# Patient Record
Sex: Male | Born: 1975 | Race: Black or African American | Hispanic: No | Marital: Married | Smoking: Never smoker
Health system: Southern US, Community
[De-identification: ages and names within clinical notes are randomized; demographics above are authoritative.]

## PROBLEM LIST (undated history)

## (undated) DIAGNOSIS — I10 Essential (primary) hypertension: Secondary | ICD-10-CM

---

## 2007-09-29 ENCOUNTER — Ambulatory Visit: Payer: Self-pay | Admitting: Family Medicine

## 2013-10-18 DIAGNOSIS — Z3169 Encounter for other general counseling and advice on procreation: Secondary | ICD-10-CM | POA: Insufficient documentation

## 2015-10-10 ENCOUNTER — Other Ambulatory Visit: Payer: Self-pay | Admitting: Nurse Practitioner

## 2015-10-10 ENCOUNTER — Ambulatory Visit
Admission: RE | Admit: 2015-10-10 | Discharge: 2015-10-10 | Disposition: A | Discharge status: Home / Self Care | Payer: Managed Care, Other (non HMO) | Source: Ambulatory Visit | Attending: Nurse Practitioner | Admitting: Nurse Practitioner

## 2015-10-10 DIAGNOSIS — R1011 Right upper quadrant pain: Secondary | ICD-10-CM | POA: Diagnosis present

## 2015-10-10 DIAGNOSIS — R109 Unspecified abdominal pain: Secondary | ICD-10-CM

## 2015-11-15 DIAGNOSIS — R079 Chest pain, unspecified: Secondary | ICD-10-CM | POA: Insufficient documentation

## 2016-04-11 DIAGNOSIS — Z79899 Other long term (current) drug therapy: Secondary | ICD-10-CM | POA: Insufficient documentation

## 2016-04-11 DIAGNOSIS — L209 Atopic dermatitis, unspecified: Secondary | ICD-10-CM | POA: Insufficient documentation

## 2017-01-28 DIAGNOSIS — E7800 Pure hypercholesterolemia, unspecified: Secondary | ICD-10-CM | POA: Insufficient documentation

## 2017-01-31 ENCOUNTER — Other Ambulatory Visit: Payer: Self-pay | Admitting: Internal Medicine

## 2017-01-31 DIAGNOSIS — R109 Unspecified abdominal pain: Secondary | ICD-10-CM

## 2017-02-05 ENCOUNTER — Ambulatory Visit
Admission: RE | Admit: 2017-02-05 | Discharge: 2017-02-05 | Disposition: A | Discharge status: Home / Self Care | Payer: Managed Care, Other (non HMO) | Source: Ambulatory Visit | Attending: Internal Medicine | Admitting: Internal Medicine

## 2017-02-05 DIAGNOSIS — R93422 Abnormal radiologic findings on diagnostic imaging of left kidney: Secondary | ICD-10-CM | POA: Insufficient documentation

## 2017-02-05 DIAGNOSIS — R109 Unspecified abdominal pain: Secondary | ICD-10-CM | POA: Diagnosis present

## 2017-02-11 DIAGNOSIS — Q632 Ectopic kidney: Secondary | ICD-10-CM | POA: Insufficient documentation

## 2018-01-06 IMAGING — US US ABDOMEN LIMITED
1 series · 14 of 25 positions shown · non-contrast
Comparison: None.

CLINICAL DATA: Right upper quadrant pain

EXAM:
US ABDOMEN LIMITED - RIGHT UPPER QUADRANT

[Series 1: us abdomen limited · 0.20mm/px · 14 of 53 slices shown]
[im 1/53]
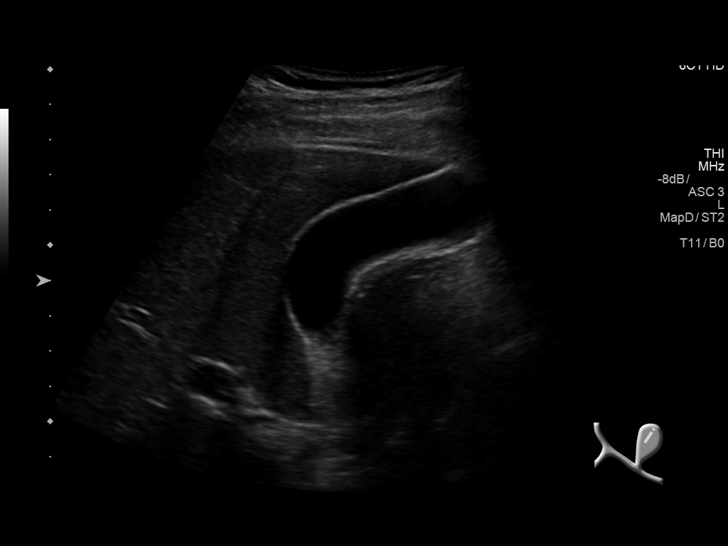
[im 5/53]
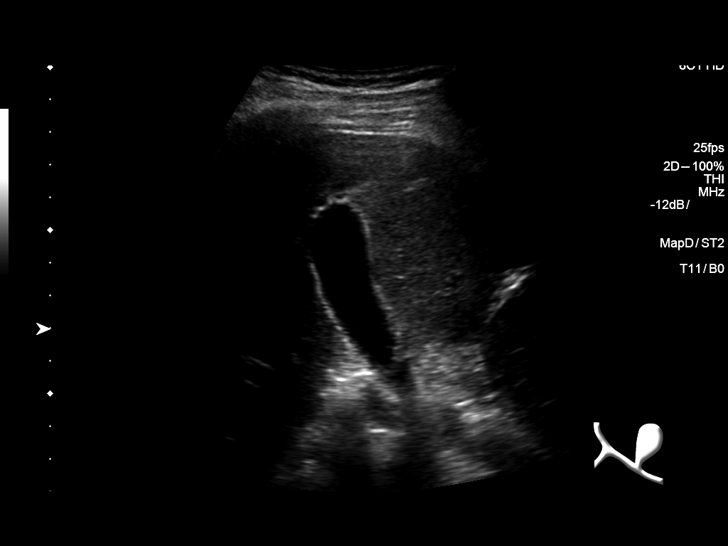
[im 9/53]
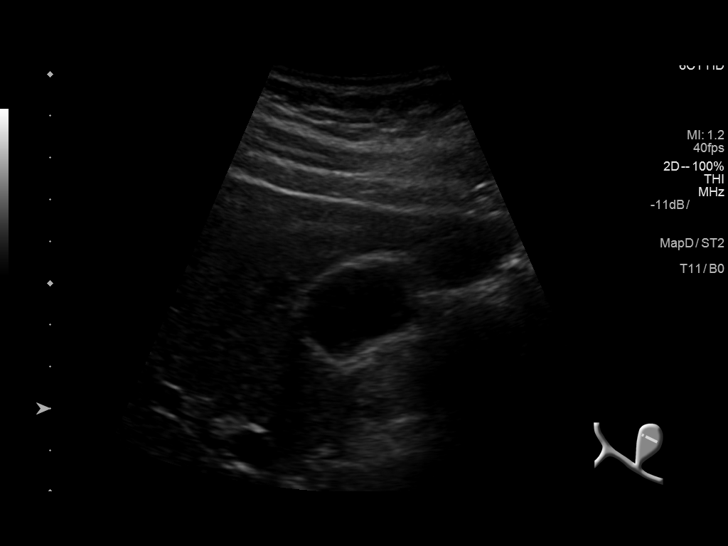
[im 14/53]
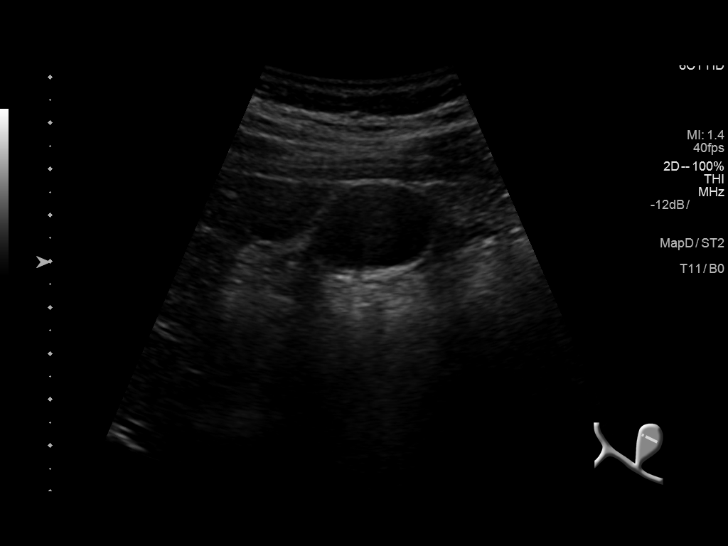
[im 18/53]
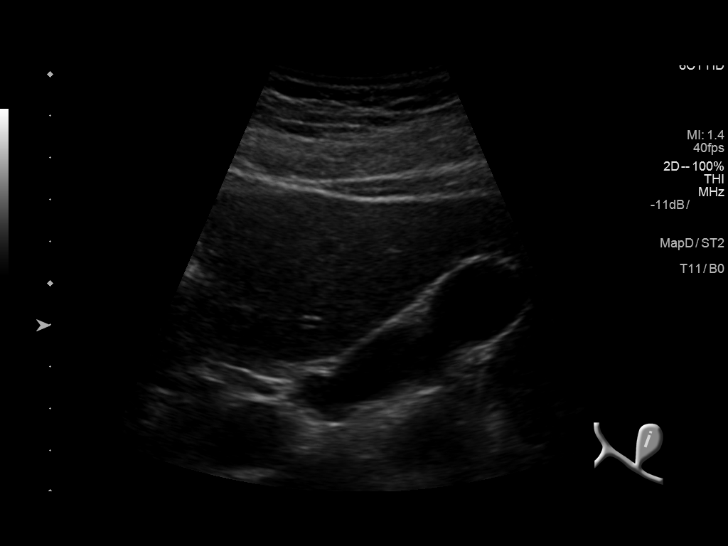
[im 20/53]
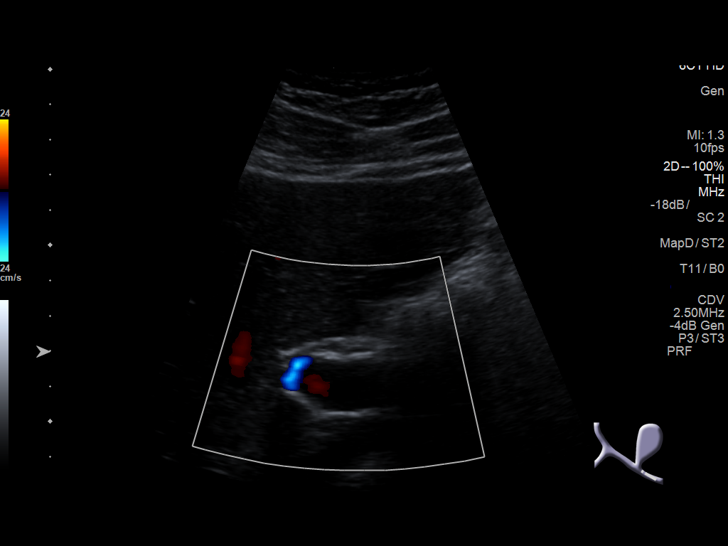
[im 24/53]
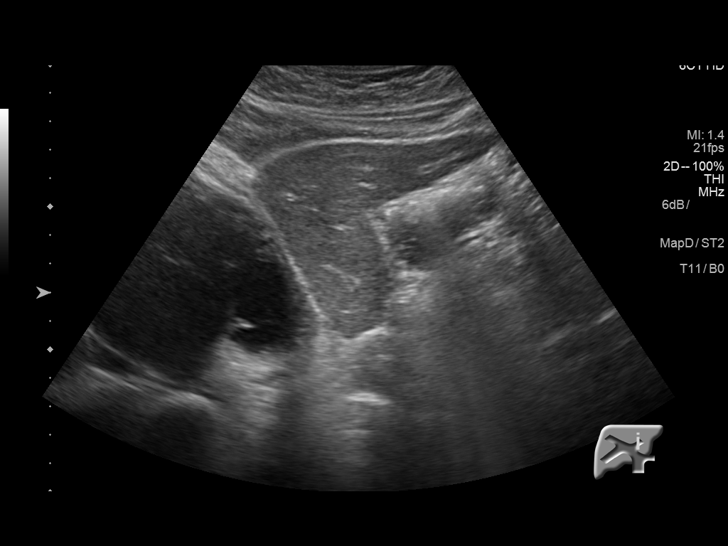
[im 29/53]
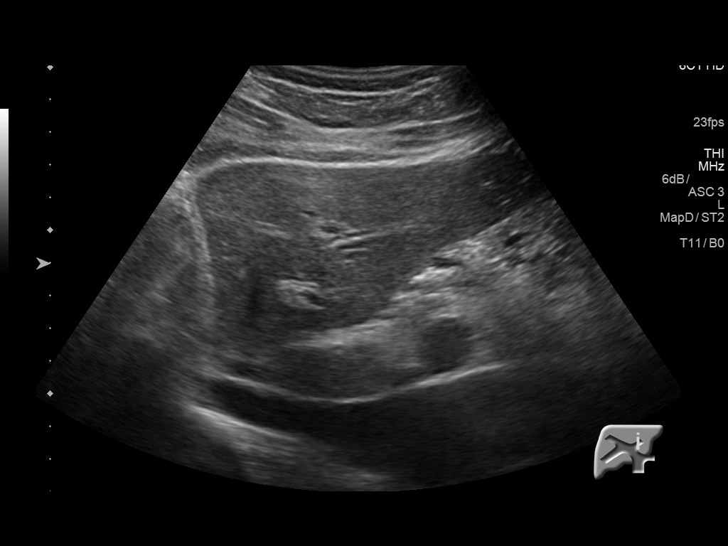
[im 33/53]
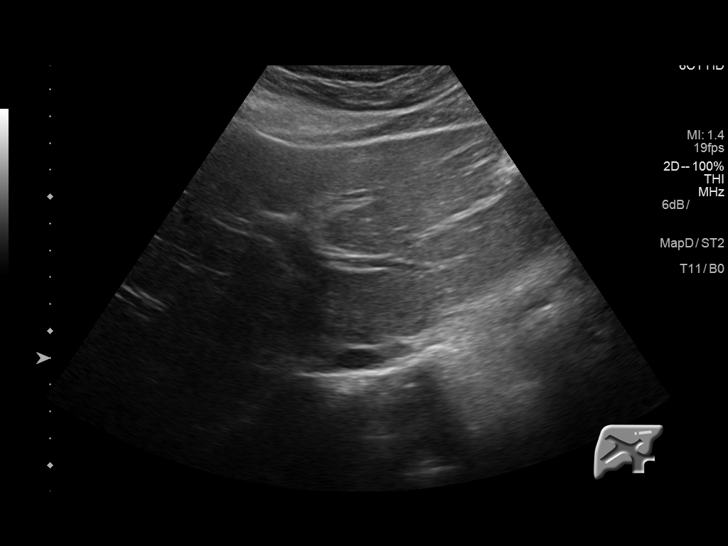
[im 35/53]
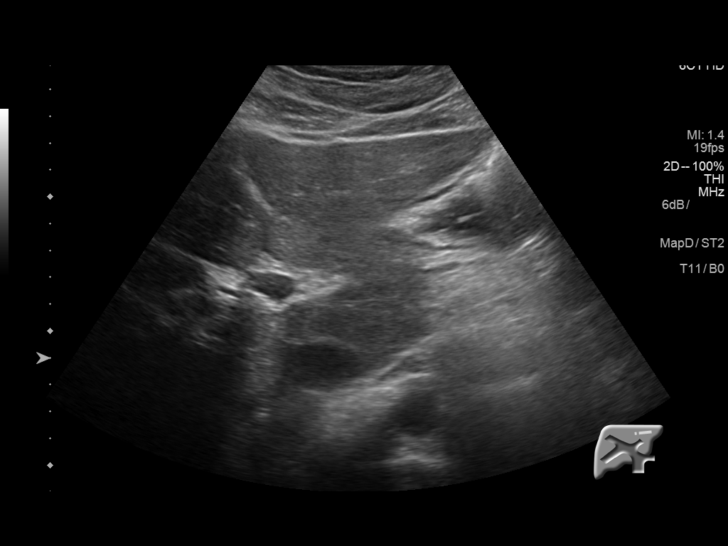
[im 40/53]
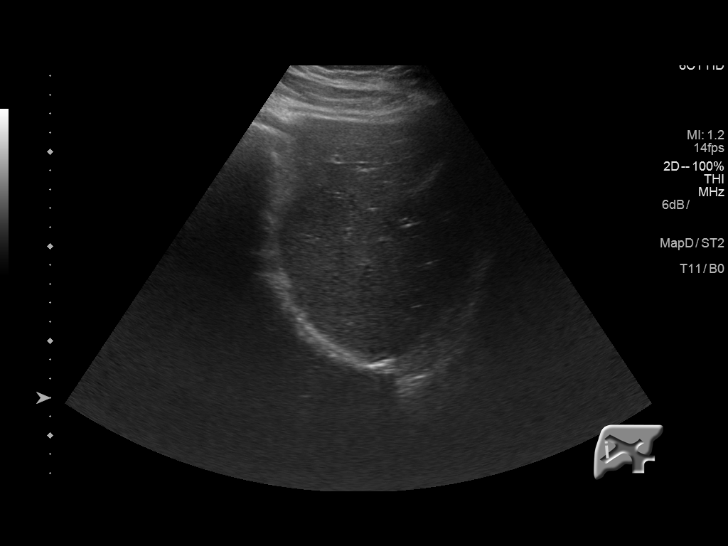
[im 44/53]
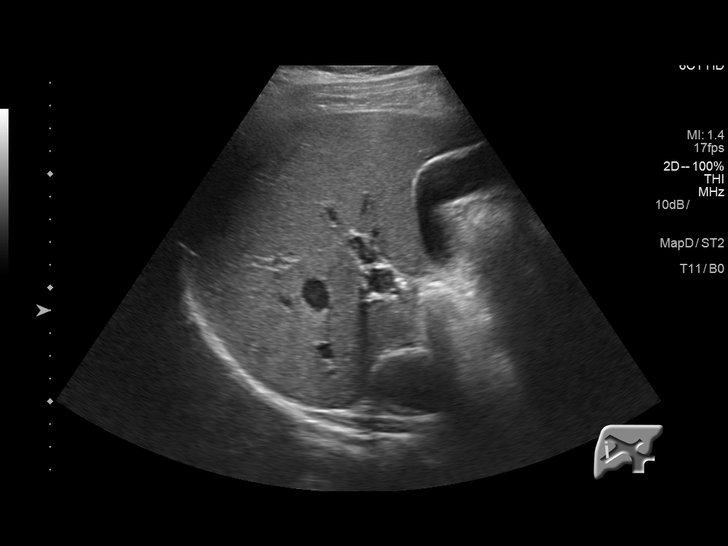
[im 48/53]
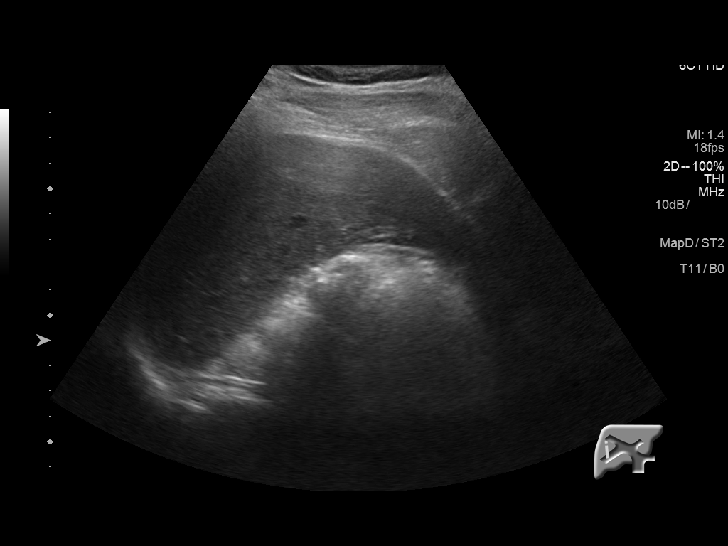
[im 53/53]
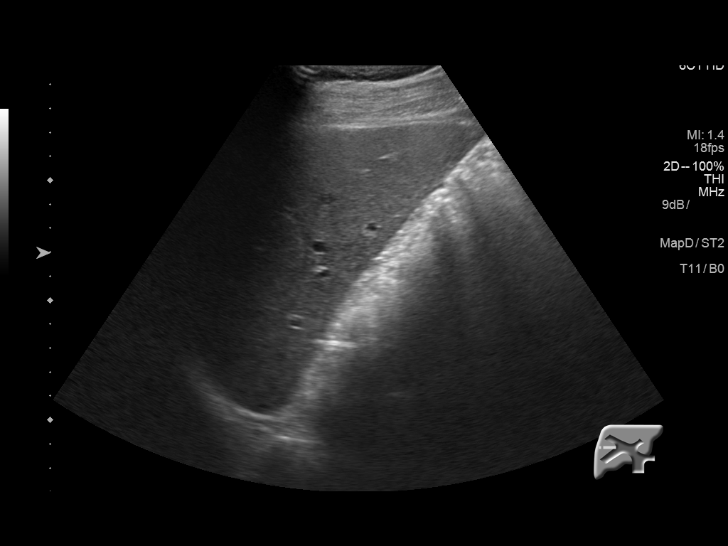

[14 of 25 positions shown; findings below may reference images not displayed]

FINDINGS: Gallbladder:

No gallstones or wall thickening visualized. No pericholecystic
fluid. No sonographic Murphy sign noted by sonographer.

Common bile duct:

Diameter: 2 mm. No intrahepatic or extrahepatic biliary duct
dilatation.

Liver:

No focal lesion identified. Within normal limits in parenchymal
echogenicity.

Right kidney is not appreciable in the expected right hepatorenal
fossa location.
IMPRESSION: Right kidney is not appreciable in the expected right hepatorenal
fossa region. Question absent or possibly heterotopic location for
right kidney. Study otherwise unremarkable.

## 2018-12-04 ENCOUNTER — Other Ambulatory Visit: Payer: Self-pay | Admitting: Internal Medicine

## 2018-12-04 DIAGNOSIS — N5089 Other specified disorders of the male genital organs: Secondary | ICD-10-CM

## 2018-12-08 ENCOUNTER — Encounter (INDEPENDENT_AMBULATORY_CARE_PROVIDER_SITE_OTHER): Payer: Self-pay

## 2018-12-08 ENCOUNTER — Ambulatory Visit
Admission: RE | Admit: 2018-12-08 | Discharge: 2018-12-08 | Disposition: A | Discharge status: Home / Self Care | Payer: Managed Care, Other (non HMO) | Source: Ambulatory Visit | Attending: Internal Medicine | Admitting: Internal Medicine

## 2018-12-08 ENCOUNTER — Other Ambulatory Visit: Payer: Self-pay

## 2018-12-08 DIAGNOSIS — I861 Scrotal varices: Secondary | ICD-10-CM | POA: Insufficient documentation

## 2018-12-08 DIAGNOSIS — N5089 Other specified disorders of the male genital organs: Secondary | ICD-10-CM | POA: Insufficient documentation

## 2019-05-05 IMAGING — US US ABDOMEN COMPLETE
1 series · 14 of 25 positions shown · non-contrast
Comparison: 10/10/2015

CLINICAL DATA: Bilateral flank pain intermittent [REDACTED].

EXAM:
ABDOMEN ULTRASOUND COMPLETE

[Series 1: us abdomen complete · 0.23mm/px · 14 of 98 slices shown]
[im 1/98]
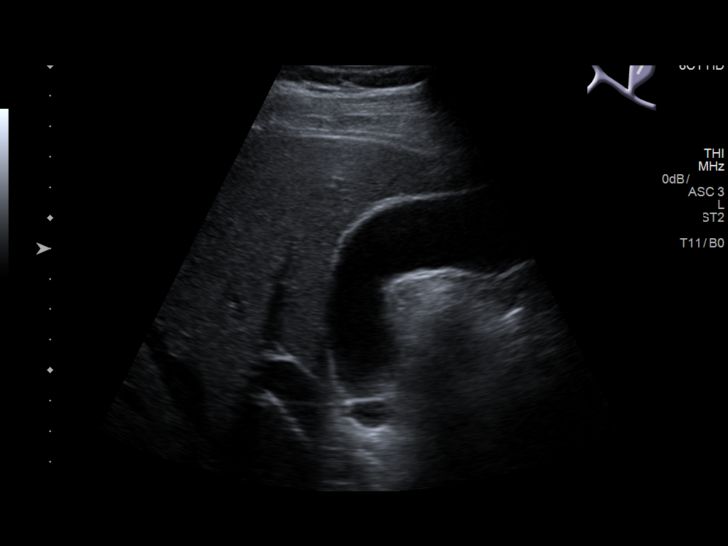
[im 9/98]
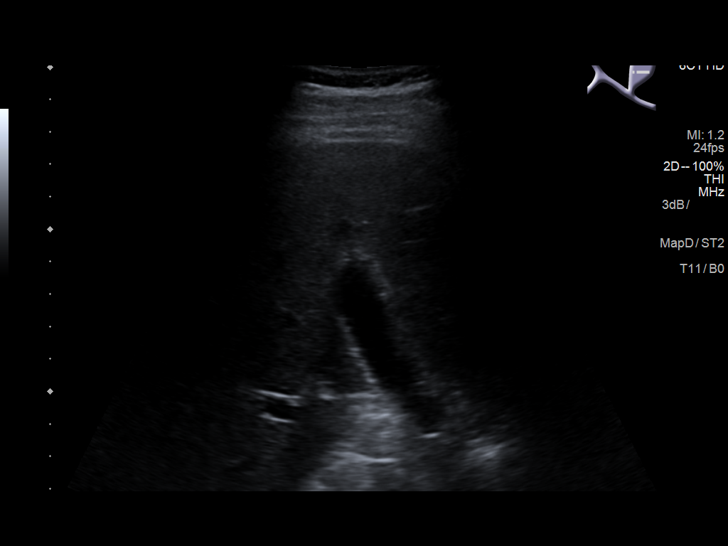
[im 17/98]
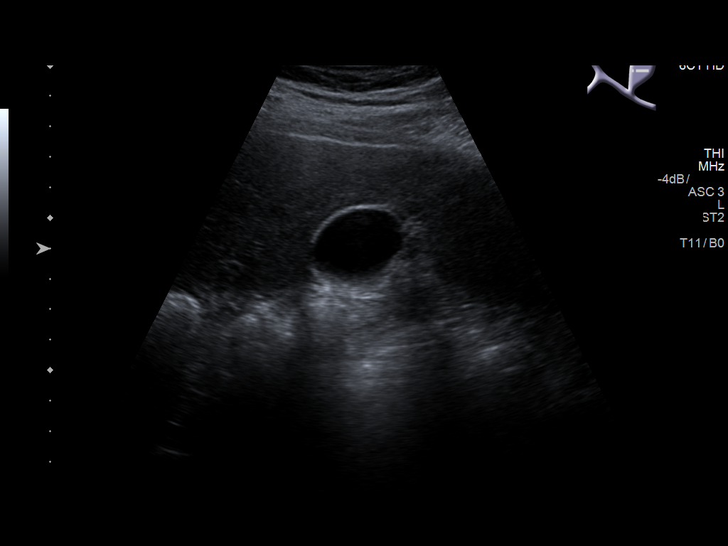
[im 25/98]
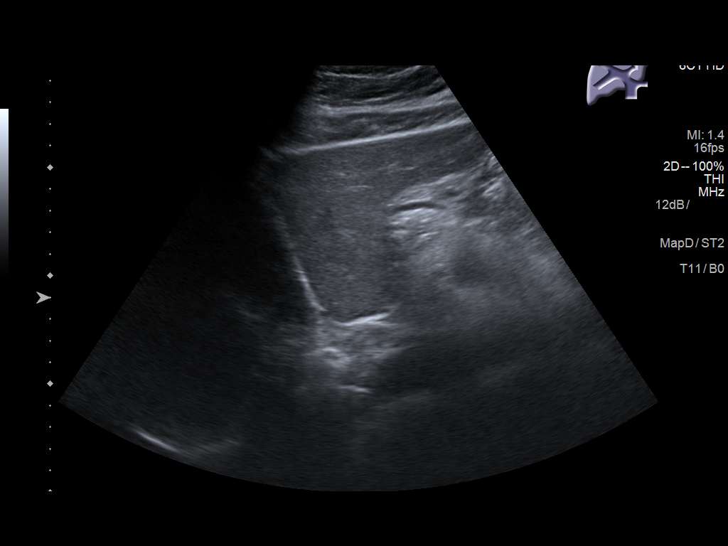
[im 33/98]
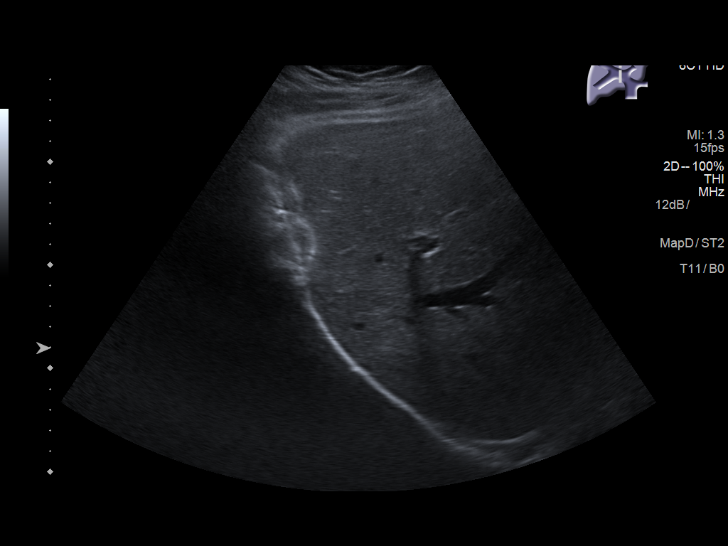
[im 37/98]
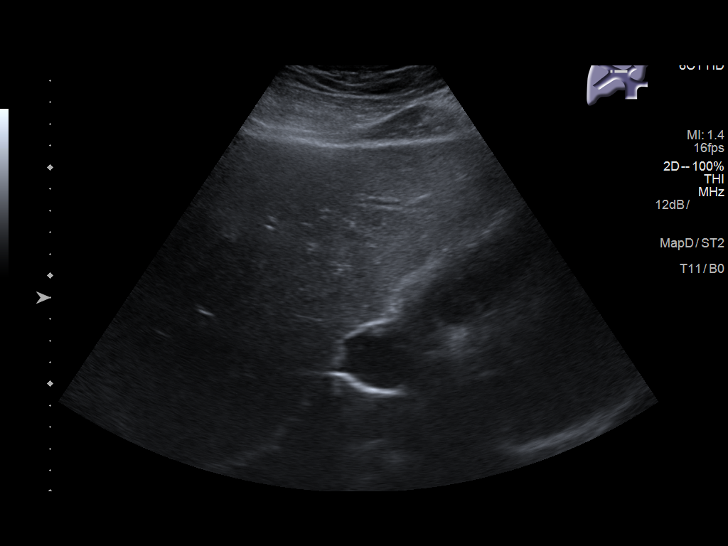
[im 45/98]
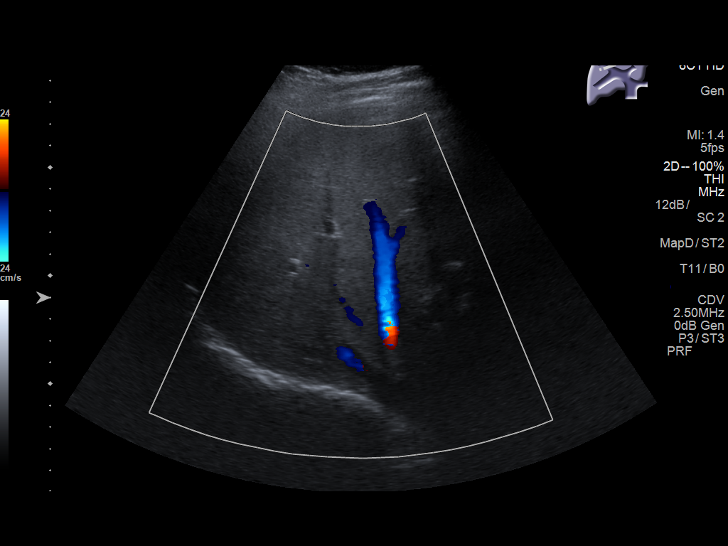
[im 53/98]
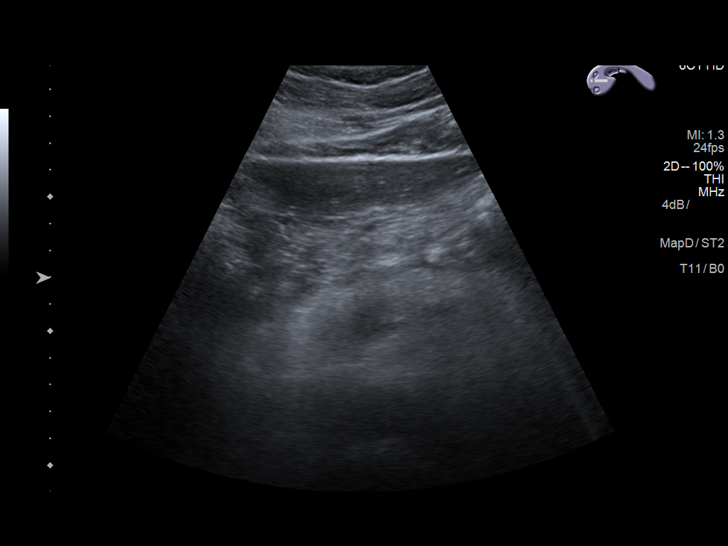
[im 61/98]
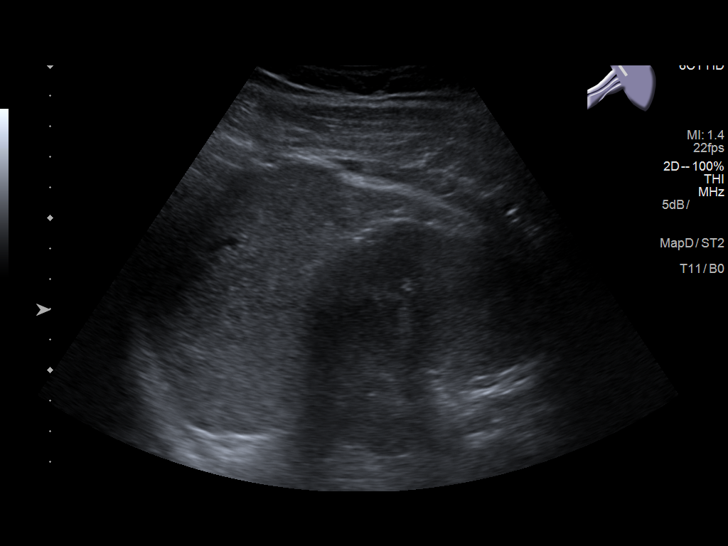
[im 65/98]
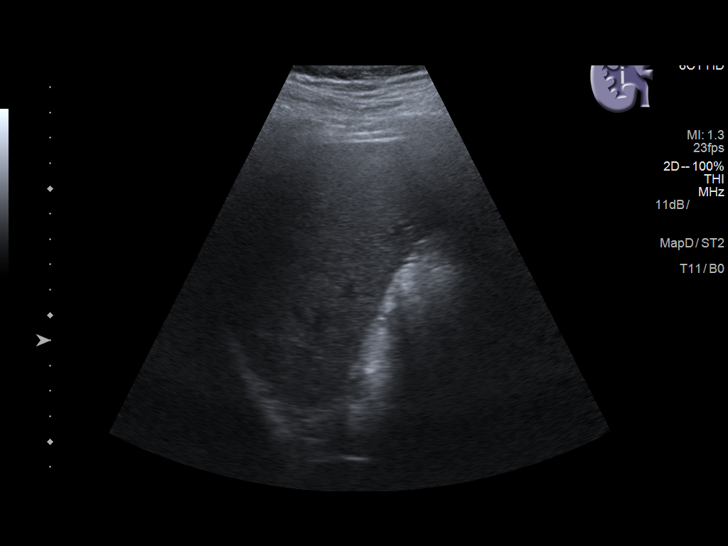
[im 73/98]
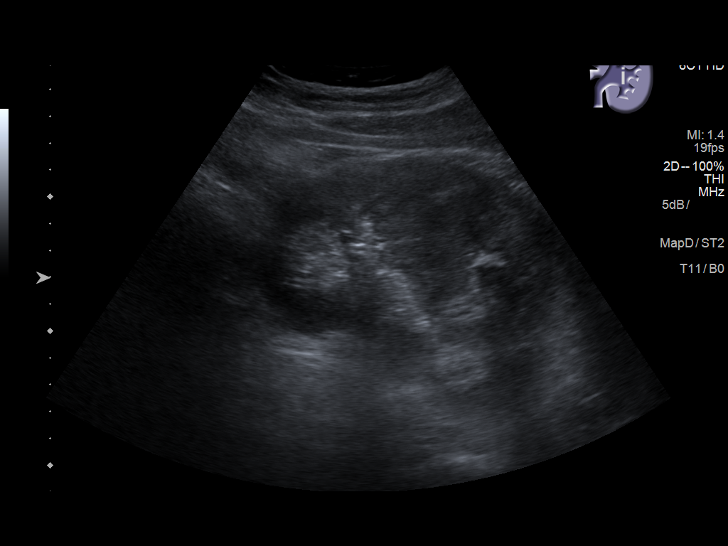
[im 81/98]
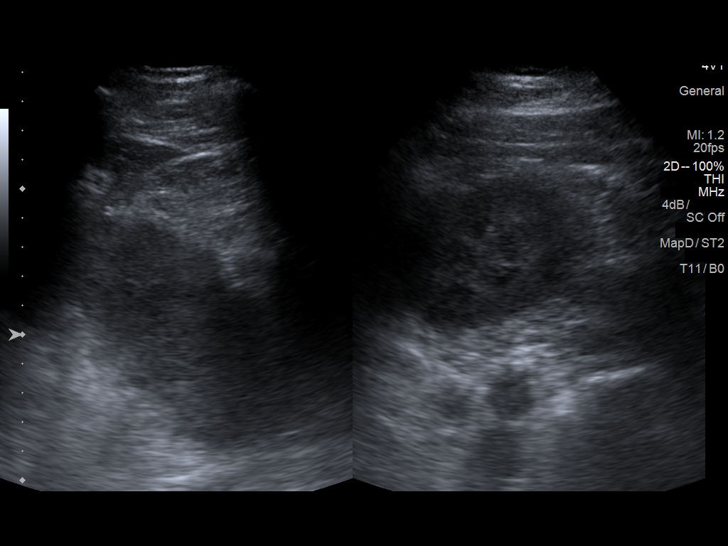
[im 89/98]
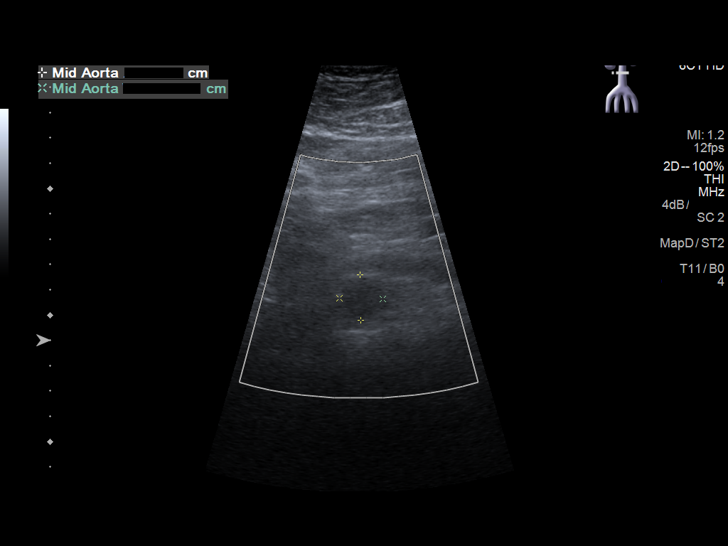
[im 98/98]
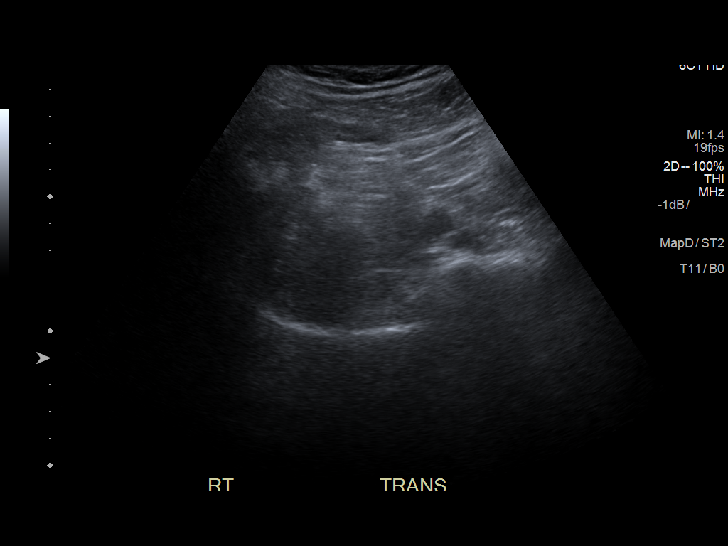

[14 of 25 positions shown; findings below may reference images not displayed]

FINDINGS: Gallbladder: No gallstones or wall thickening visualized. No
sonographic Murphy sign noted by sonographer.

Common bile duct: Diameter: 2.9 mm, normal

Liver: No focal lesion identified. Within normal limits in
parenchymal echogenicity.

IVC: No abnormality visualized.

Pancreas: Visualized portion unremarkable.

Spleen: Size and appearance within normal limits.

Right Kidney: Length: Right kidney is not identified.

Left Kidney: Length: 12.6 cm atypical configuration of the organ
suggests crossed fused ectopia. No hydronephrosis or focal lesion
otherwise.

Abdominal aorta: No aneurysm visualized.

Other findings: None.
IMPRESSION: No cause of acute pain identified. No normal right kidney is seen.
Left kidney shows a typical configuration, suggesting crossed fused
ectopia. No sign of hydronephrosis.

## 2020-03-01 ENCOUNTER — Ambulatory Visit
Admission: EM | Admit: 2020-03-01 | Discharge: 2020-03-01 | Disposition: A | Discharge status: Home / Self Care | Payer: Managed Care, Other (non HMO) | Attending: Family Medicine | Admitting: Family Medicine

## 2020-03-01 ENCOUNTER — Encounter: Payer: Self-pay | Admitting: Emergency Medicine

## 2020-03-01 ENCOUNTER — Other Ambulatory Visit: Payer: Self-pay

## 2020-03-01 DIAGNOSIS — R1013 Epigastric pain: Secondary | ICD-10-CM | POA: Insufficient documentation

## 2020-03-01 HISTORY — DX: Essential (primary) hypertension: I10

## 2020-03-01 LAB — COMPREHENSIVE METABOLIC PANEL
ALT: 23 U/L (ref 0–44)
AST: 20 U/L (ref 15–41)
Albumin: 4.1 g/dL (ref 3.5–5.0)
Alkaline Phosphatase: 65 U/L (ref 38–126)
Anion gap: 8 (ref 5–15)
BUN: 10 mg/dL (ref 6–20)
CO2: 28 mmol/L (ref 22–32)
Calcium: 9 mg/dL (ref 8.9–10.3)
Chloride: 100 mmol/L (ref 98–111)
Creatinine, Ser: 1.26 mg/dL — ABNORMAL HIGH (ref 0.61–1.24)
GFR calc Af Amer: >60 mL/min (ref >60)
GFR calc non Af Amer: >60 mL/min (ref >60)
Glucose, Bld: 111 mg/dL — ABNORMAL HIGH (ref 70–99)
Potassium: 4.2 mmol/L (ref 3.5–5.1)
Sodium: 136 mmol/L (ref 135–145)
Total Bilirubin: 0.9 mg/dL (ref 0.3–1.2)
Total Protein: 7.8 g/dL (ref 6.5–8.1)

## 2020-03-01 LAB — CBC WITH DIFFERENTIAL/PLATELET
Abs Immature Granulocytes: 0.02 10*3/uL (ref 0.00–0.07)
Basophils Absolute: 0 10*3/uL (ref 0.0–0.1)
Basophils Relative: 1 %
Eosinophils Absolute: 0.2 10*3/uL (ref 0.0–0.5)
Eosinophils Relative: 3 %
HCT: 43.4 % (ref 39.0–52.0)
Hemoglobin: 15.2 g/dL (ref 13.0–17.0)
Immature Granulocytes: 0 %
Lymphocytes Relative: 14 %
Lymphs Abs: 0.9 10*3/uL (ref 0.7–4.0)
MCH: 31.8 pg (ref 26.0–34.0)
MCHC: 35 g/dL (ref 30.0–36.0)
MCV: 90.8 fL (ref 80.0–100.0)
Monocytes Absolute: 0.4 10*3/uL (ref 0.1–1.0)
Monocytes Relative: 6 %
Neutro Abs: 4.6 10*3/uL (ref 1.7–7.7)
Neutrophils Relative %: 76 %
Platelets: 304 10*3/uL (ref 150–400)
RBC: 4.78 MIL/uL (ref 4.22–5.81)
RDW: 12.3 % (ref 11.5–15.5)
WBC: 6 10*3/uL (ref 4.0–10.5)
nRBC: 0 % (ref 0.0–0.2)

## 2020-03-01 LAB — LIPASE, BLOOD: Lipase: 26 U/L (ref 11–51)

## 2020-03-01 MED ORDER — PANTOPRAZOLE SODIUM 40 MG PO TBEC: 40.0000 mg | DELAYED_RELEASE_TABLET | Freq: Every day | ORAL | 0 refills | Status: AC

## 2020-03-01 NOTE — Discharge Instructions (Signed)
Lots of fluids.  Medication as directed.  Follow up with PCP.  Take care  Dr. Adriana Simas

## 2020-03-01 NOTE — ED Triage Notes (Signed)
Patient c/o intermittent abdominal pain that started on Monday. He reports decreased appetite since Monday. Denies vomiting and diarrhea.

## 2020-03-01 NOTE — ED Provider Notes (Signed)
[facility]    CSN: [id number] Arrival date & time: 03/01/20  0849      History   Chief Complaint Chief Complaint  Patient presents with  . Abdominal Pain   HPI  44 year old male presents with epigastric pain.  Pain started on Monday and has been intermittent.  Now seems to be more constant.  He reports a decreased appetite.  Has had some nausea. Denies vomiting, diarrhea, constipation.  No fever.  No known inciting event.  Rates his pain a 6/10 in severity.  Described as cramping.  He states that the pain seems to radiate downward and towards his back.  No relieving factors.  No other associated symptoms.  No other complaints.  Past Medical History:  Diagnosis Date  . Hypertension    Home Medications    Prior to Admission medications   Medication Sig Start Date End Date Taking? Authorizing Provider  losartan (COZAAR) 25 MG tablet Take by mouth. 07/08/19  Yes [provider]  Multiple Vitamin (MULTI-VITAMIN) tablet Take 1 tablet by mouth daily.   Yes [provider]  pantoprazole (PROTONIX) 40 MG tablet Take 1 tablet (40 mg total) by mouth daily. 03/01/20   [name], DO   Social History Social History   Tobacco Use  . Smoking status: Never Smoker  . Smokeless tobacco: Never Used  Vaping Use  . Vaping Use: Never used  Substance Use Topics  . Alcohol use: Yes  . Drug use: Never     Allergies   Tape   Review of Systems Review of Systems  Constitutional: Positive for appetite change. Negative for fever.  Gastrointestinal: Positive for abdominal pain and nausea. Negative for constipation, diarrhea and vomiting.   Physical Exam Triage Vital Signs ED Triage Vitals  Enc Vitals Group     BP 03/01/20 0859 (!) 150/102     Pulse Rate 03/01/20 0859 67     Resp 03/01/20 0859 18     Temp 03/01/20 0859 97.8 F (36.6 C)     Temp Source 03/01/20 0859 Oral     SpO2 03/01/20 0859 100 %     Weight 03/01/20 0900 268 lb (121.6 kg)      Height 03/01/20 0900 6\' 3"  (1.905 m)     Head Circumference --      Peak Flow --      Pain Score 03/01/20 0900 6     Pain Loc --      Pain Edu? --      Excl. in GC? --    Updated Vital Signs BP (!) 150/102 (BP Location: Right Arm)   Pulse 67   Temp 97.8 F (36.6 C) (Oral)   Resp 18   Ht 6\' 3"  (1.905 m)   Wt 121.6 kg   SpO2 100%   BMI 33.50 kg/m   Visual Acuity Right Eye Distance:   Left Eye Distance:   Bilateral Distance:    Right Eye Near:   Left Eye Near:    Bilateral Near:     Physical Exam Vitals and nursing note reviewed.  Constitutional:      General: He is not in acute distress.    Appearance: Normal appearance. He is not ill-appearing.  HENT:     Head: Normocephalic and atraumatic.  Eyes:     General:        Right eye: No discharge.        Left eye: No discharge.     Conjunctiva/sclera: Conjunctivae normal.  Cardiovascular:  Rate and Rhythm: Normal rate and regular rhythm.  Pulmonary:     Effort: Pulmonary effort is normal.     Breath sounds: Normal breath sounds. No wheezing, rhonchi or rales.  Abdominal:     General: There is no distension.     Palpations: Abdomen is soft.     Comments: Mild tenderness in the epigastric region.  Neurological:     Mental Status: He is alert.  Psychiatric:        Mood and Affect: Mood normal.        Behavior: Behavior normal.    UC Treatments / Results  Labs (all labs ordered are listed, but only abnormal results are displayed) Labs Reviewed  COMPREHENSIVE METABOLIC PANEL - Abnormal; Notable for the following components:      Result Value   Glucose, Bld 111 (*)    Creatinine, Ser 1.26 (*)    All other components within normal limits  CBC WITH DIFFERENTIAL/PLATELET  LIPASE, BLOOD    EKG   Radiology No results found.  Procedures Procedures (including critical care time)  Medications Ordered in UC Medications - No data to display  Initial Impression / Assessment and Plan / UC Course  I have  reviewed the triage vital signs and the nursing notes.  Pertinent labs & imaging results that were available during my care of the patient were reviewed by me and considered in my medical decision making (see chart for details).    44 year old male presents with epigastric pain.  Laboratory studies notable for mildly elevated creatinine.  No baseline available.  Advised to increase fluid intake.  I suspect that this is secondary to gastritis.  Protonix as prescribed.  Supportive care.  Advised to follow-up with primary care physician.  Will need endoscopy if persists.  Final Clinical Impressions(s) / UC Diagnoses   Final diagnoses:  Epigastric pain     Discharge Instructions     Lots of fluids.  Medication as directed.  Follow up with PCP.  Take care  Dr. Adriana Simas    ED Prescriptions    Medication Sig Dispense Auth. Provider   pantoprazole (PROTONIX) 40 MG tablet Take 1 tablet (40 mg total) by mouth daily. 30 tablet [name], DO     PDMP not reviewed this encounter.   [name], DO 03/01/20 1051

## 2020-04-11 ENCOUNTER — Ambulatory Visit
Admission: EM | Admit: 2020-04-11 | Discharge: 2020-04-11 | Disposition: A | Discharge status: Home / Self Care | Payer: Managed Care, Other (non HMO) | Attending: Emergency Medicine | Admitting: Emergency Medicine

## 2020-04-11 DIAGNOSIS — H01001 Unspecified blepharitis right upper eyelid: Secondary | ICD-10-CM | POA: Diagnosis not present

## 2020-04-11 DIAGNOSIS — H00011 Hordeolum externum right upper eyelid: Secondary | ICD-10-CM | POA: Diagnosis not present

## 2020-04-11 MED ORDER — ERYTHROMYCIN 5 MG/GM OP OINT
TOPICAL_OINTMENT | OPHTHALMIC | 0 refills | Status: AC
Start: 2020-04-11 — End: ?

## 2020-04-11 NOTE — Discharge Instructions (Signed)
Continue to apply warm compresses. ° °Perform eyelid hygiene with baby shampoo in a rich lather and vigorous scrubbing of your eyelids twice daily. Rinse thoroughly with water. ° °Apply Erythromycin ointment to the eyelid margin with a clean Q-tip twice daily, and a 1/2 inch ribbon to the inside of the lower lid. Blink several times to liquify the ointment and spread it ober the inside of your eyelids. ° °If your symptoms do not improve, or you develop changes in your vision follow up with your eye doctor.   °

## 2020-04-11 NOTE — ED Triage Notes (Addendum)
Patient in today w/ c/o right eye swelling since Sunday. NKI reported.  Patient states he has been using warm and cold compresses to relieve some of the discomfort and swelling.

## 2020-04-11 NOTE — ED Provider Notes (Signed)
[facility]    CSN: [id number] Arrival date & time: 04/11/20  4098      History   Chief Complaint Chief Complaint  Patient presents with   Facial Swelling    Right eye swelling    HPI Lonnell Chaput is a 44 y.o. male.   44 yo male here for evaluation of swelling to his right upper eyelid x 2 days.  He reports that symptoms developed Sunday and he began applying warm compresses. This morning he noticed a goopy snot colored discharge. He denies changes in vision but does describe a film and gritty sensation to his eye. Wears glasses.      Past Medical History:  Diagnosis Date   Hypertension     There are no problems to display for this patient.   History reviewed. No pertinent surgical history.     Home Medications    Prior to Admission medications   Medication Sig Start Date End Date Taking? Authorizing Provider  losartan (COZAAR) 25 MG tablet Take by mouth. 07/08/19  Yes [provider]  Multiple Vitamin (MULTI-VITAMIN) tablet Take 1 tablet by mouth daily.   Yes [provider]  erythromycin ophthalmic ointment Place a 1/2 inch ribbon of ointment into the lower eyelid. 04/11/20   Becky Augusta, NP  pantoprazole (PROTONIX) 40 MG tablet Take 1 tablet (40 mg total) by mouth daily. 03/01/20   [name], DO    Family History History reviewed. No pertinent family history.  Social History Social History   Tobacco Use   Smoking status: Never Smoker   Smokeless tobacco: Never Used  Building services engineer Use: Never used  Substance Use Topics   Alcohol use: Yes   Drug use: Never     Allergies   Tape   Review of Systems Review of Systems  Constitutional: Negative for activity change, chills and fever.  HENT: Negative for congestion, rhinorrhea and sore throat.   Eyes: Positive for discharge and itching. Negative for pain, redness and visual disturbance.  Respiratory: Negative for cough and shortness of breath.     Cardiovascular: Negative for chest pain.  Skin: Negative.   Hematological: Negative.      Physical Exam Triage Vital Signs ED Triage Vitals  Enc Vitals Group     BP 04/11/20 0933 (!) 135/97     Pulse Rate 04/11/20 0933 (!) 54     Resp 04/11/20 0933 18     Temp 04/11/20 0933 98.3 F (36.8 C)     Temp Source 04/11/20 0933 Oral     SpO2 04/11/20 0933 100 %     Weight 04/11/20 0935 260 lb (117.9 kg)     Height 04/11/20 0935 6\' 3"  (1.905 m)     Head Circumference --      Peak Flow --      Pain Score 04/11/20 0934 3     Pain Loc --      Pain Edu? --      Excl. in GC? --    No data found.  Updated Vital Signs BP (!) 135/97 (BP Location: Left Arm)    Pulse (!) 54    Temp 98.3 F (36.8 C) (Oral)    Resp 18    Ht 6\' 3"  (1.905 m)    Wt 260 lb (117.9 kg)    SpO2 100%    BMI 32.50 kg/m   Visual Acuity Right Eye Distance:   Left Eye Distance:   Bilateral Distance:  Right Eye Near:   Left Eye Near:    Bilateral Near:     Physical Exam Constitutional:      Appearance: Normal appearance. He is normal weight.  HENT:     Head: Normocephalic and atraumatic.     Nose: Nose normal.  Eyes:     General: Lids are everted, no foreign bodies appreciated. Vision grossly intact. Gaze aligned appropriately. No scleral icterus.       Right eye: Hordeolum present. No discharge.        Left eye: No hordeolum.     Extraocular Movements: Extraocular movements intact.     Conjunctiva/sclera: Conjunctivae normal.     Pupils: Pupils are equal, round, and reactive to light.     Comments: The right upper eyelid is moderately swollen and there is an isolated spot of swelling located in the middle of the upper lid along the lash margin. No head noted on the area and no discharge appreciated on exam.   Musculoskeletal:     Cervical back: Normal range of motion and neck supple.  Neurological:     Mental Status: He is alert and oriented to person, place, and time.  Psychiatric:        Mood and  Affect: Mood normal.        Thought Content: Thought content normal.        Judgment: Judgment normal.      UC Treatments / Results  Labs (all labs ordered are listed, but only abnormal results are displayed) Labs Reviewed - No data to display  EKG   Radiology No results found.  Procedures Procedures (including critical care time)  Medications Ordered in UC Medications - No data to display  Initial Impression / Assessment and Plan / UC Course  I have reviewed the triage vital signs and the nursing notes.  Pertinent labs & imaging results that were available during my care of the patient were reviewed by me and considered in my medical decision making (see chart for details).   Patient is here for evaluation of swelling to the right upper eyelid that started 2 days ago. He has been using warm compresses and this morning noted discharge on his lashes. He does not have any vision changes. Notes a gritty sensation and a film feeling on his eye. Conjunctiva are not injected and no discharge is noted on exam.  Will treat for stye with blepharitis.   Discussed eyelid hygiene and topical antibiotic application. Patient verbalized understanding.    Final Clinical Impressions(s) / UC Diagnoses   Final diagnoses:  Hordeolum externum of right upper eyelid  Blepharitis of right upper eyelid, unspecified type     Discharge Instructions     Continue to apply warm compresses.  Perform eyelid hygiene with baby shampoo in a rich lather and vigorous scrubbing of your eyelids twice daily. Rinse thoroughly with water.  Apply Erythromycin ointment to the eyelid margin with a clean Q-tip twice daily, and a 1/2 inch ribbon to the inside of the lower lid. Blink several times to liquify the ointment and spread it ober the inside of your eyelids.  If your symptoms do not improve, or you develop changes in your vision follow up with your eye doctor.     ED Prescriptions    Medication Sig  Dispense Auth. Provider   erythromycin ophthalmic ointment Place a 1/2 inch ribbon of ointment into the lower eyelid. 3.5 g Becky Augusta, NP     PDMP not reviewed this encounter.  Becky Augusta, NP 04/11/20 1003

## 2020-07-12 DIAGNOSIS — R1013 Epigastric pain: Secondary | ICD-10-CM | POA: Insufficient documentation

## 2021-09-21 DIAGNOSIS — R7302 Impaired glucose tolerance (oral): Secondary | ICD-10-CM | POA: Insufficient documentation

## 2021-09-23 DIAGNOSIS — E669 Obesity, unspecified: Secondary | ICD-10-CM | POA: Insufficient documentation

## 2021-11-05 ENCOUNTER — Ambulatory Visit (INDEPENDENT_AMBULATORY_CARE_PROVIDER_SITE_OTHER): Payer: BC Managed Care – PPO

## 2021-11-05 ENCOUNTER — Ambulatory Visit
Admission: EM | Admit: 2021-11-05 | Discharge: 2021-11-05 | Disposition: A | Discharge status: Home / Self Care | Payer: BC Managed Care – PPO

## 2021-11-05 DIAGNOSIS — S92534A Nondisplaced fracture of distal phalanx of right lesser toe(s), initial encounter for closed fracture: Secondary | ICD-10-CM

## 2021-11-05 DIAGNOSIS — S99921A Unspecified injury of right foot, initial encounter: Secondary | ICD-10-CM | POA: Diagnosis not present

## 2021-11-05 DIAGNOSIS — I10 Essential (primary) hypertension: Secondary | ICD-10-CM | POA: Insufficient documentation

## 2021-11-05 NOTE — ED Provider Notes (Signed)
?[facility] ? ? ? ?CSN: [id number] ?Arrival date & time: 11/05/21  1017 ? ? ?  ? ?History   ?Chief Complaint ?Chief Complaint  ?Patient presents with  ? Toe Injury  ?  Stubbed toe on corner of bed. Possible fracture. Need x-ray - Entered by patient  ? ? ?HPI ?Richard Travis is a 46 y.o. male.  ? ?Patient presented with a 3-day history of right middle toe pain.  Reports that he stubbed his toe on the bed frame and heard a popping sound.  He initially thought this was the bed frame but believes it is now his toe.  He reports pain is minimal at rest but increases with attempted ambulation or palpation.  He has not tried any over-the-counter medication for symptom management.  Denies any previous injury or surgery.  He denies any numbness or paresthesias.  Denies history of diabetes. ? ? ?Past Medical History:  ?Diagnosis Date  ? Hypertension   ? ? ?Patient Active Problem List  ? Diagnosis Date Noted  ? Hypertension 11/05/2021  ? Obesity with serious comorbidity 09/23/2021  ? IGT (impaired glucose tolerance) 09/21/2021  ? Dyspepsia 07/12/2020  ? 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 Hypercholesterolemia 01/28/2017  ? Atopic eczema 04/11/2016  ? High risk medication use 04/11/2016  ? Chest pain with low risk for cardiac etiology 11/15/2015  ? Encounter for other general counseling and advice on procreation 10/18/2013  ? ? ?History reviewed. No pertinent surgical history. ? ? ? ? ?Home Medications   ? ?Prior to Admission medications   ?Medication Sig Start Date End Date Taking? Authorizing Provider  ?losartan (COZAAR) 100 MG tablet Take by mouth. 07/30/21 07/30/22 Yes [provider]  ?rosuvastatin (CRESTOR) 5 MG tablet Take 5 mg by mouth daily. 09/24/21  Yes [provider]  ?acetaminophen (TYLENOL) 500 MG tablet Take by mouth.    [provider]  ?[facility] HOME TEST KIT Use as Directed on the Package 08/06/21   [provider]  ?erythromycin ophthalmic ointment Place a 1/2 inch  ribbon of ointment into the lower eyelid. 04/11/20   Margarette Canada, NP  ?ibuprofen (ADVIL) 200 MG tablet Take by mouth.    [provider]  ?losartan (COZAAR) 25 MG tablet Take by mouth. 07/08/19   [provider]  ?losartan (COZAAR) 50 MG tablet Take 25 mg by mouth daily. 10/18/21   [provider]  ?Multiple Vitamin (MULTI-VITAMIN) tablet Take 1 tablet by mouth daily.    [provider]  ?pantoprazole (PROTONIX) 40 MG tablet Take 1 tablet (40 mg total) by mouth daily. 03/01/20   Coral Spikes, DO  ? ? ?Family History ?History reviewed. No pertinent family history. ? ?Social History ?Social History  ? ?Tobacco Use  ? Smoking status: Never  ? Smokeless tobacco: Never  ?Vaping Use  ? Vaping Use: Never used  ?Substance Use Topics  ? Alcohol use: Yes  ?  Comment: Occ.  ? Drug use: Never  ? ? ? ?Allergies   ?Tape ? ? ?Review of Systems ?Review of Systems  ?Constitutional:  Positive for activity change. Negative for appetite change, fatigue and fever.  ?Musculoskeletal:  Positive for arthralgias, gait problem and joint swelling.  ?Skin:  Positive for color change. Negative for wound.  ?Neurological:  Negative for dizziness, weakness, light-headedness, numbness and headaches.  ? ? ?Physical Exam ?Triage Vital Signs ?ED Triage Vitals  ?Enc Vitals Group  ?   BP 11/05/21 1043 124/87  ?   Pulse  Rate 11/05/21 1043 (!) 57  ?   Resp 11/05/21 1043 18  ?   Temp 11/05/21 1043 98.3 ?F (36.8 ?C)  ?   Temp Source 11/05/21 1043 Oral  ?   SpO2 11/05/21 1043 99 %  ?   Weight 11/05/21 1041 270 lb (122.5 kg)  ?   Height 11/05/21 1041 '6\' 3"'  (1.905 m)  ?   Head Circumference --   ?   Peak Flow --   ?   Pain Score 11/05/21 1041 0  ?   Pain Loc --   ?   Pain Edu? --   ?   Excl. in [place]? --   ? ?No data found. ? ?Updated Vital Signs ?BP 124/87 (BP Location: Left Arm)   Pulse (!) 57   Temp 98.3 ?F (36.8 ?C) (Oral)   Resp 18   Ht '6\' 3"'  (1.905 m)   Wt 270 lb (122.5 kg)   SpO2 99%   BMI 33.75 kg/m?  ? ?Visual  Acuity ?Right Eye Distance:   ?Left Eye Distance:   ?Bilateral Distance:   ? ?Right Eye Near:   ?Left Eye Near:    ?Bilateral Near:    ? ?Physical Exam ?Vitals reviewed.  ?Constitutional:   ?   General: He is awake.  ?   Appearance: Normal appearance. He is well-developed. He is not ill-appearing.  ?   Comments: Very pleasant male appears stated age in no acute distress sitting comfortably in exam room  ?HENT:  ?   Head: Normocephalic and atraumatic.  ?   Mouth/Throat:  ?   Pharynx: No oropharyngeal exudate, posterior oropharyngeal erythema or uvula swelling.  ?Cardiovascular:  ?   Rate and Rhythm: Normal rate and regular rhythm.  ?   Pulses:     ?     Posterior tibial pulses are 2+ on the right side.  ?   Heart sounds: Normal heart sounds, S1 normal and S2 normal. No murmur heard. ?   Comments: Capillary refill within 2 seconds right toes ?Pulmonary:  ?   Effort: Pulmonary effort is normal.  ?   Breath sounds: Normal breath sounds. No stridor. No wheezing, rhonchi or rales.  ?   Comments: Clear to auscultation bilaterally ?Musculoskeletal:  ?   Right foot: Decreased range of motion. Swelling, tenderness and bony tenderness present.  ?   Comments: Right foot: Swelling, ecchymosis, tenderness at distal right middle toe.  No deformity noted.  Foot neurovascularly intact.  ?Neurological:  ?   Mental Status: He is alert.  ?Psychiatric:     ?   Behavior: Behavior is cooperative.  ? ? ? ?UC Treatments / Results  ?Labs ?(all labs ordered are listed, but only abnormal results are displayed) ?Labs Reviewed - No data to display ? ?EKG ? ? ?Radiology ?DG Toe 3rd Right ? ?Result Date: 11/05/2021 ?CLINICAL DATA:  Blunt trauma to the third toe EXAM: RIGHT THIRD TOE COMPARISON:  None. FINDINGS: Fracture at the base of the distal phalanx third digit. Fracture along the dorsal margin. Small dorsal plate fracture enters articular surface. No dislocation IMPRESSION: Dorsal plate fracture at the base of the distal phalanx third digit.  Electronically Signed   By: Suzy Bouchard M.D.   On: 11/05/2021 11:23   ? ?Procedures ?Procedures (including critical care time) ? ?Medications Ordered in UC ?Medications - No data to display ? ?Initial Impression / Assessment and Plan / UC Course  ?I have reviewed the triage vital signs and the nursing notes. ? ?  Pertinent labs & imaging results that were available during my care of the patient were reviewed by me and considered in my medical decision making (see chart for details). ? ?  ? ?X-ray obtained given mechanism of injury showed dorsal plate fracture at distal phalanx.  Patient was placed in buddy tape and postop shoe for comfort and support.  Recommended RICE protocol for additional symptom relief.  He does not require analgesic medication his pain is minimal.  Recommended that he follow-up with podiatry; he is already established with a podiatrist was encouraged to call and schedule an appointment with this group.  Discussed that if anything worsens and he has increasing pain, swelling, numbness, paresthesias he should be seen immediately.  Strict return precautions given.  He declined work excuse note. ? ?Final Clinical Impressions(s) / UC Diagnoses  ? ?Final diagnoses:  ?Nondisplaced fracture of distal phalanx of right lesser toe(s), initial encounter for closed fracture  ?Injury of toe on right foot, initial encounter  ? ? ? ?Discharge Instructions   ? ?  ?You have a fracture of the end of your right third toe.  Use buddy taping and postop shoe for comfort.  Elevate this and use ice for additional symptom relief.  You can alternate Tylenol or ibuprofen for pain.  As we discussed, and be referral to follow-up with podiatrist please call to schedule an appointment with the group you are already established with.  If you have any worsening symptoms including difficulty walking, swelling, numbness or tingling sensation, increasing pain you should be seen immediately. ? ? ? ? ?ED Prescriptions   ?None ?   ? ?PDMP not reviewed this encounter. ?  ?Terrilee Croak, PA-C ?11/05/21 1134 ? ?

## 2021-11-05 NOTE — Discharge Instructions (Signed)
You have a fracture of the end of your right third toe.  Use buddy taping and postop shoe for comfort.  Elevate this and use ice for additional symptom relief.  You can alternate Tylenol or ibuprofen for pain.  As we discussed, and be referral to follow-up with podiatrist please call to schedule an appointment with the group you are already established with.  If you have any worsening symptoms including difficulty walking, swelling, numbness or tingling sensation, increasing pain you should be seen immediately. ?

## 2021-11-05 NOTE — ED Triage Notes (Signed)
Patient is here for "Toe injury/pain". Stubbed "3rd digit, right foot" on corner of bed on Saturday. Redness, pain. ?

## 2023-03-07 IMAGING — CR DG TOE 3RD 2+V*R*
3 series · 3 of 3 positions shown · non-contrast
Comparison: None.

CLINICAL DATA: Blunt trauma to the third toe

EXAM:
RIGHT THIRD TOE

[toe ap]
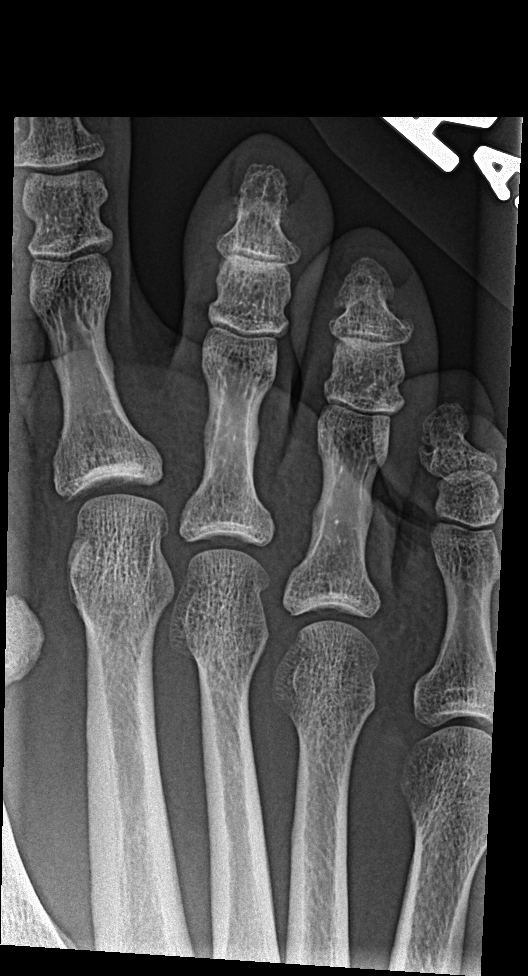

[toe obl]
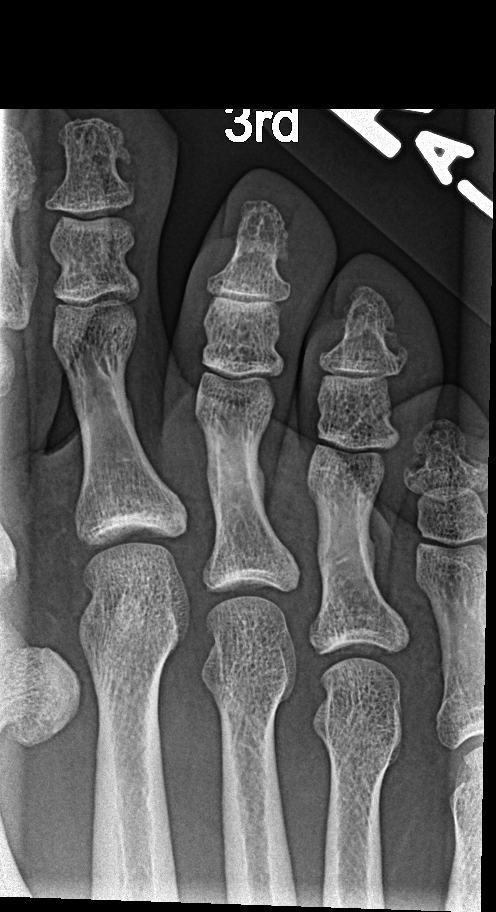

[toe lat]
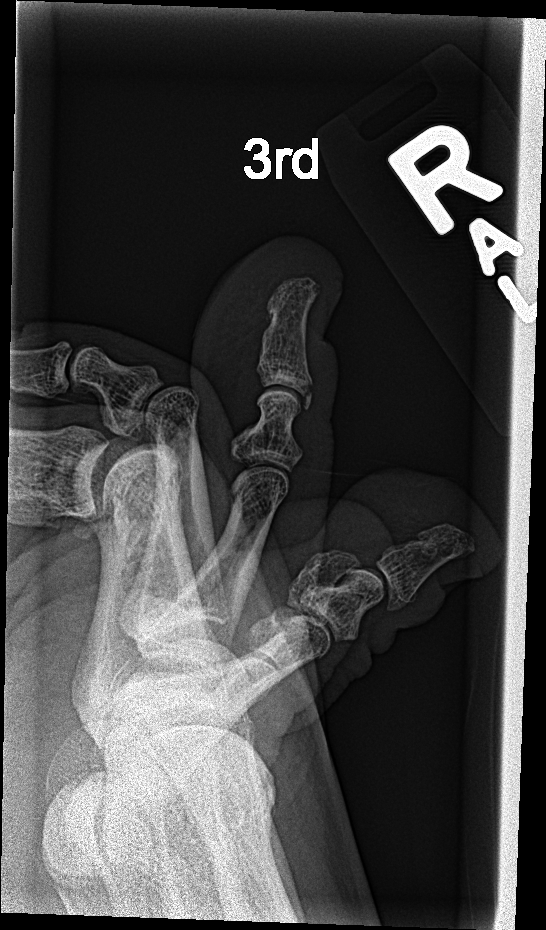

[3 of 3 positions shown; findings below may reference images not displayed]

FINDINGS: Fracture at the base of the distal phalanx third digit. Fracture
along the dorsal margin. Small dorsal plate fracture enters
articular surface. No dislocation
IMPRESSION: Dorsal plate fracture at the base of the distal phalanx third digit.
